# Patient Record
Sex: Male | Born: 1995 | Hispanic: No | Marital: Single | Smoking: Current every day smoker
Health system: Southern US, Community
[De-identification: ages and names within clinical notes are randomized; demographics above are authoritative.]

---
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
## 2014-03-16 ENCOUNTER — Emergency Department (HOSPITAL_COMMUNITY)
Admission: EM | Admit: 2014-03-16 | Discharge: 2014-03-16 | Disposition: A | Discharge status: Home / Self Care | Payer: No Typology Code available for payment source | Attending: Emergency Medicine | Admitting: Emergency Medicine

## 2014-03-16 ENCOUNTER — Encounter (HOSPITAL_COMMUNITY): Payer: Self-pay | Admitting: Emergency Medicine

## 2014-03-16 ENCOUNTER — Emergency Department (HOSPITAL_COMMUNITY): Payer: No Typology Code available for payment source

## 2014-03-16 DIAGNOSIS — F17200 Nicotine dependence, unspecified, uncomplicated: Secondary | ICD-10-CM | POA: Insufficient documentation

## 2014-03-16 DIAGNOSIS — Z79899 Other long term (current) drug therapy: Secondary | ICD-10-CM | POA: Diagnosis not present

## 2014-03-16 DIAGNOSIS — R0789 Other chest pain: Secondary | ICD-10-CM | POA: Diagnosis not present

## 2014-03-16 DIAGNOSIS — Z791 Long term (current) use of non-steroidal anti-inflammatories (NSAID): Secondary | ICD-10-CM | POA: Diagnosis not present

## 2014-03-16 DIAGNOSIS — R079 Chest pain, unspecified: Secondary | ICD-10-CM | POA: Diagnosis present

## 2014-03-16 LAB — BASIC METABOLIC PANEL
Anion gap: 13 (ref 5–15)
BUN: 6 mg/dL (ref 6–23)
CALCIUM: 9.1 mg/dL (ref 8.4–10.5)
CHLORIDE: 99 meq/L (ref 96–112)
CO2: 27 mEq/L (ref 19–32)
CREATININE: 0.75 mg/dL (ref 0.50–1.35)
GFR calc non Af Amer: >90 mL/min (ref >90)
Glucose, Bld: 110 mg/dL — ABNORMAL HIGH (ref 70–99)
Potassium: 3.8 mEq/L (ref 3.7–5.3)
Sodium: 139 mEq/L (ref 137–147)

## 2014-03-16 LAB — TROPONIN I
Troponin I: <0.3 ng/mL (ref <0.30)
Troponin I: <0.3 ng/mL (ref <0.30)

## 2014-03-16 LAB — CBC
HCT: 43.9 % (ref 39.0–52.0)
Hemoglobin: 15.3 g/dL (ref 13.0–17.0)
MCH: 29.3 pg (ref 26.0–34.0)
MCHC: 34.9 g/dL (ref 30.0–36.0)
MCV: 83.9 fL (ref 78.0–100.0)
PLATELETS: 161 10*3/uL (ref 150–400)
RBC: 5.23 MIL/uL (ref 4.22–5.81)
RDW: 12.8 % (ref 11.5–15.5)
WBC: 7.5 10*3/uL (ref 4.0–10.5)

## 2014-03-16 LAB — D-DIMER, QUANTITATIVE: D-Dimer, Quant: 0.32 ug/mL-FEU (ref 0.00–0.48)

## 2014-03-16 MED ORDER — OMEPRAZOLE 20 MG PO CPDR: 20.0000 mg | DELAYED_RELEASE_CAPSULE | Freq: Every day | ORAL | Status: AC

## 2014-03-16 MED ORDER — NAPROXEN 500 MG PO TABS: 500.0000 mg | ORAL_TABLET | Freq: Two times a day (BID) | ORAL | Status: AC

## 2014-03-16 MED ORDER — KETOROLAC TROMETHAMINE 60 MG/2ML IM SOLN
60.0000 mg | Freq: Once | INTRAMUSCULAR | Status: AC
  Administered 2014-03-16: 60 mg via INTRAMUSCULAR
  Filled 2014-03-16: qty 2

## 2014-03-16 MED ORDER — GI COCKTAIL ~~LOC~~
30.0000 mL | Freq: Once | ORAL | Status: AC
  Administered 2014-03-16: 30 mL via ORAL
  Filled 2014-03-16: qty 30

## 2014-03-16 NOTE — Discharge Instructions (Signed)
Chest Pain (Nonspecific) There is no evidence of heart attack or blood clot in the lung. Take the anti-inflammatory medication as prescribed. Return to the ED if you develop new or worsening symptoms. It is often hard to give a specific diagnosis for the cause of chest pain. There is always a chance that your pain could be related to something serious, such as a heart attack or a blood clot in the lungs. You need to follow up with your health care provider for further evaluation. CAUSES   Heartburn.  Pneumonia or bronchitis.  Anxiety or stress.  Inflammation around your heart (pericarditis) or lung (pleuritis or pleurisy).  A blood clot in the lung.  A collapsed lung (pneumothorax). It can develop suddenly on its own (spontaneous pneumothorax) or from trauma to the chest.  Shingles infection (herpes zoster virus). The chest wall is composed of bones, muscles, and cartilage. Any of these can be the source of the pain.  The bones can be bruised by injury.  The muscles or cartilage can be strained by coughing or overwork.  The cartilage can be affected by inflammation and become sore (costochondritis). DIAGNOSIS  Lab tests or other studies may be needed to find the cause of your pain. Your health care provider may have you take a test called an ambulatory electrocardiogram (ECG). An ECG records your heartbeat patterns over a 24-hour period. You may also have other tests, such as:  Transthoracic echocardiogram (TTE). During echocardiography, sound waves are used to evaluate how blood flows through your heart.  Transesophageal echocardiogram (TEE).  Cardiac monitoring. This allows your health care provider to monitor your heart rate and rhythm in real time.  Holter monitor. This is a portable device that records your heartbeat and can help diagnose heart arrhythmias. It allows your health care provider to track your heart activity for several days, if needed.  Stress tests by exercise  or by giving medicine that makes the heart beat faster. TREATMENT   Treatment depends on what may be causing your chest pain. Treatment may include:  Acid blockers for heartburn.  Anti-inflammatory medicine.  Pain medicine for inflammatory conditions.  Antibiotics if an infection is present.  You may be advised to change lifestyle habits. This includes stopping smoking and avoiding alcohol, caffeine, and chocolate.  You may be advised to keep your head raised (elevated) when sleeping. This reduces the chance of acid going backward from your stomach into your esophagus. Most of the time, nonspecific chest pain will improve within 2-3 days with rest and mild pain medicine.  HOME CARE INSTRUCTIONS   If antibiotics were prescribed, take them as directed. Finish them even if you start to feel better.  For the next few days, avoid physical activities that bring on chest pain. Continue physical activities as directed.  Do not use any tobacco products, including cigarettes, chewing tobacco, or electronic cigarettes.  Avoid drinking alcohol.  Only take medicine as directed by your health care provider.  Follow your health care provider's suggestions for further testing if your chest pain does not go away.  Keep any follow-up appointments you made. If you do not go to an appointment, you could develop lasting (chronic) problems with pain. If there is any problem keeping an appointment, call to reschedule. SEEK MEDICAL CARE IF:   Your chest pain does not go away, even after treatment.  You have a rash with blisters on your chest.  You have a fever. SEEK IMMEDIATE MEDICAL CARE IF:   You have  increased chest pain or pain that spreads to your arm, neck, jaw, back, or abdomen.  You have shortness of breath.  You have an increasing cough, or you cough up blood.  You have severe back or abdominal pain.  You feel nauseous or vomit.  You have severe weakness.  You faint.  You have  chills. This is an emergency. Do not wait to see if the pain will go away. Get medical help at once. Call your local emergency services (911 in [place] Do not drive yourself to the hospital. MAKE SURE YOU:   Understand these instructions.  Will watch your condition.  Will get help right away if you are not doing well or get worse. Document Released: 04/13/2005 Document Revised: 07/09/2013 Document Reviewed: 02/07/2008 [facility] Patient Information 2015 [place], [place]. This information is not intended to replace advice given to you by your health care provider. Make sure you discuss any questions you have with your health care provider.

## 2014-03-16 NOTE — ED Notes (Signed)
Patient transported to X-ray 

## 2014-03-16 NOTE — ED Notes (Signed)
Pt reports chest pain since last night. Pt denies n/v/chills. Pt alert and oriented. Pt reports mild sob. No dyspnea noted in triage.

## 2014-03-16 NOTE — ED Provider Notes (Signed)
CSN: [id number]     Arrival date & time 03/16/14  0951 History  This chart was scribed for Glynn Octave, MD by Elon Spanner, ED Scribe. This patient was seen in room APA10/APA10 and the patient's care was started at 10:00 AM.   Chief Complaint  Patient presents with  . Chest Pain   HPI  HPI Comments: Jacob Cunningham is a 18 y.o. male who presents to the Emergency Department complaining of constant, unchanged central chest pain onset yesterday morning.  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  He also complains of bilateral right and left shoulder pain that lasted for approximately 10 hours and has currently resolved on the left side.  Patient states he feels mildly SOB currently.  Patient denies aggravating or alleviating factors.  Patient denies cocaine, marijuana, or other drug use.  Patient occasionally consumes alcohol.  Patient denies cough, abdominal pain, nausea, vomiting, hand tingling/numbness, other medical conditions.  NKA.   History reviewed. No pertinent past medical history. History reviewed. No pertinent past surgical history. History reviewed. No pertinent family history. History  Substance Use Topics  . Smoking status: Current Every Day Smoker -- 0.20 packs/day  . Smokeless tobacco: Not on file  . Alcohol Use: No     Comment: occasional    Review of Systems  A complete 10 system review of systems was obtained and all systems are negative except as noted in the HPI and PMH.   Allergies  Review of patient's allergies indicates no known allergies.  Home Medications   Prior to Admission medications   Medication Sig Start Date End Date Taking? Authorizing Provider  naproxen (NAPROSYN) 500 MG tablet Take 1 tablet (500 mg total) by mouth 2 (two) times daily. 03/16/14   Glynn Octave, MD  omeprazole (PRILOSEC) 20 MG capsule Take 1 capsule (20 mg total) by mouth daily. 03/16/14   Glynn Octave, MD   BP 126/72  Pulse 70  Temp(Src) 98.4  F (36.9 C) (Oral)  Resp 16  Ht  (1.651 m)  Wt 170 lb (77.111 kg)  BMI 28.29 kg/m2  SpO2 96% Physical Exam  Nursing note and vitals reviewed. Constitutional: He is oriented to person, place, and time. He appears well-developed and well-nourished. No distress.  HENT:  Head: Normocephalic and atraumatic.  Mouth/Throat: Oropharynx is clear and moist. No oropharyngeal exudate.  Eyes: Conjunctivae and EOM are normal. Pupils are equal, round, and reactive to light.  Neck: Normal range of motion. Neck supple.  No meningismus.  Cardiovascular: Normal rate, regular rhythm, normal heart sounds and intact distal pulses.   No murmur heard. Equal radial pulses.    Pulmonary/Chest: Effort normal and breath sounds normal. No respiratory distress. He exhibits no tenderness.  Abdominal: Soft. There is no tenderness. There is no rebound and no guarding.  Musculoskeletal: Normal range of motion. He exhibits no edema and no tenderness.  Tenderness to palpation across upper back and trapezius bilaterally.   Neurological: He is alert and oriented to person, place, and time. No cranial nerve deficit. He exhibits normal muscle tone. Coordination normal.  No ataxia on finger to nose bilaterally. No pronator drift. 5/5 strength throughout. CN 2-12 intact. Negative Romberg. Equal grip strength. Sensation intact. Gait is normal.   Skin: Skin is warm.  Psychiatric: He has a normal mood and affect. His behavior is normal.    ED Course  Procedures (including critical care time)  DIAGNOSTIC STUDIES: Oxygen Saturation is 99% on  RA, normal by my interpretation.    COORDINATION OF CARE:  10:13 AM Discussed treatment plan with patient at bedside.  Patient acknowledges and agrees with plan.    11:44 AM Upon recheck, patent's condition is unchanged.  Informed patient that MI and PE had been ruled out.  Will discharge patient home.    1:38 PM Informed patient of diagnosis of pericarditis.  Patient states the  pain has resolved. Will discharge patient home with anti-inflammatory medication.  Labs Review Labs Reviewed  BASIC METABOLIC PANEL - Abnormal; Notable for the following:    Glucose, Bld 110 (*)    All other components within normal limits  CBC  TROPONIN I  D-DIMER, QUANTITATIVE  TROPONIN I    Imaging Review Dg Chest 2 View  03/16/2014   CLINICAL DATA:  Chest pain.  EXAM: CHEST  2 VIEW  COMPARISON:  None.  FINDINGS: Mediastinum and hilar structures normal. The lungs are clear. Heart size normal. No pleural effusion or pneumothorax. No acute bony abnormality.  IMPRESSION: No acute cardiopulmonary disease.   Electronically Signed   By: Maisie Fus  Register   On: 03/16/2014 10:49     EKG Interpretation   Date/Time:  Sunday March 16 2014 11:46:31 EDT Ventricular Rate:  83 PR Interval:  146 QRS Duration: 94 QT Interval:  342 QTC Calculation: 402 R Axis:   77 Text Interpretation:  Sinus rhythm Borderline ST elevation, anterolateral  leads No significant change was found Confirmed by Manus Gunning  MD, Therin Vetsch  ([id number]) on 03/16/2014 12:02:44 PM      MDM   Final diagnoses:  Atypical chest pain   Central chest pain has been constant since last night. Nothing makes it better or worse. Pain does not radiate. Yesterday had some soreness over his upper back and trapezius muscles. There is mild shortness of breath. Denies any drug use cardiac history.  EKG shows ST elevation diffusely with depression. Suspicious for pericarditis. Discussed with Dr. Sharyn Lull who agrees.  Chest x-ray negative. D-dimer negative.  Pain resolved with toradol. Repeat EKG unchanged.  Troponin negative x2.  Will treat supportively with NSAIDs for possible pericarditis.  Doubt ACS, PE, aortic dissection.  I personally performed the services described in this documentation, which was scribed in my presence. The recorded information has been reviewed and is accurate.   Glynn Octave, MD 03/16/14 [phone]

## 2015-07-26 IMAGING — CR DG CHEST 2V
2 series · 2 of 2 positions shown · non-contrast
Comparison: None.

CLINICAL DATA: Chest pain.

EXAM:
CHEST  2 VIEW

[view not recorded (1 of 2)]
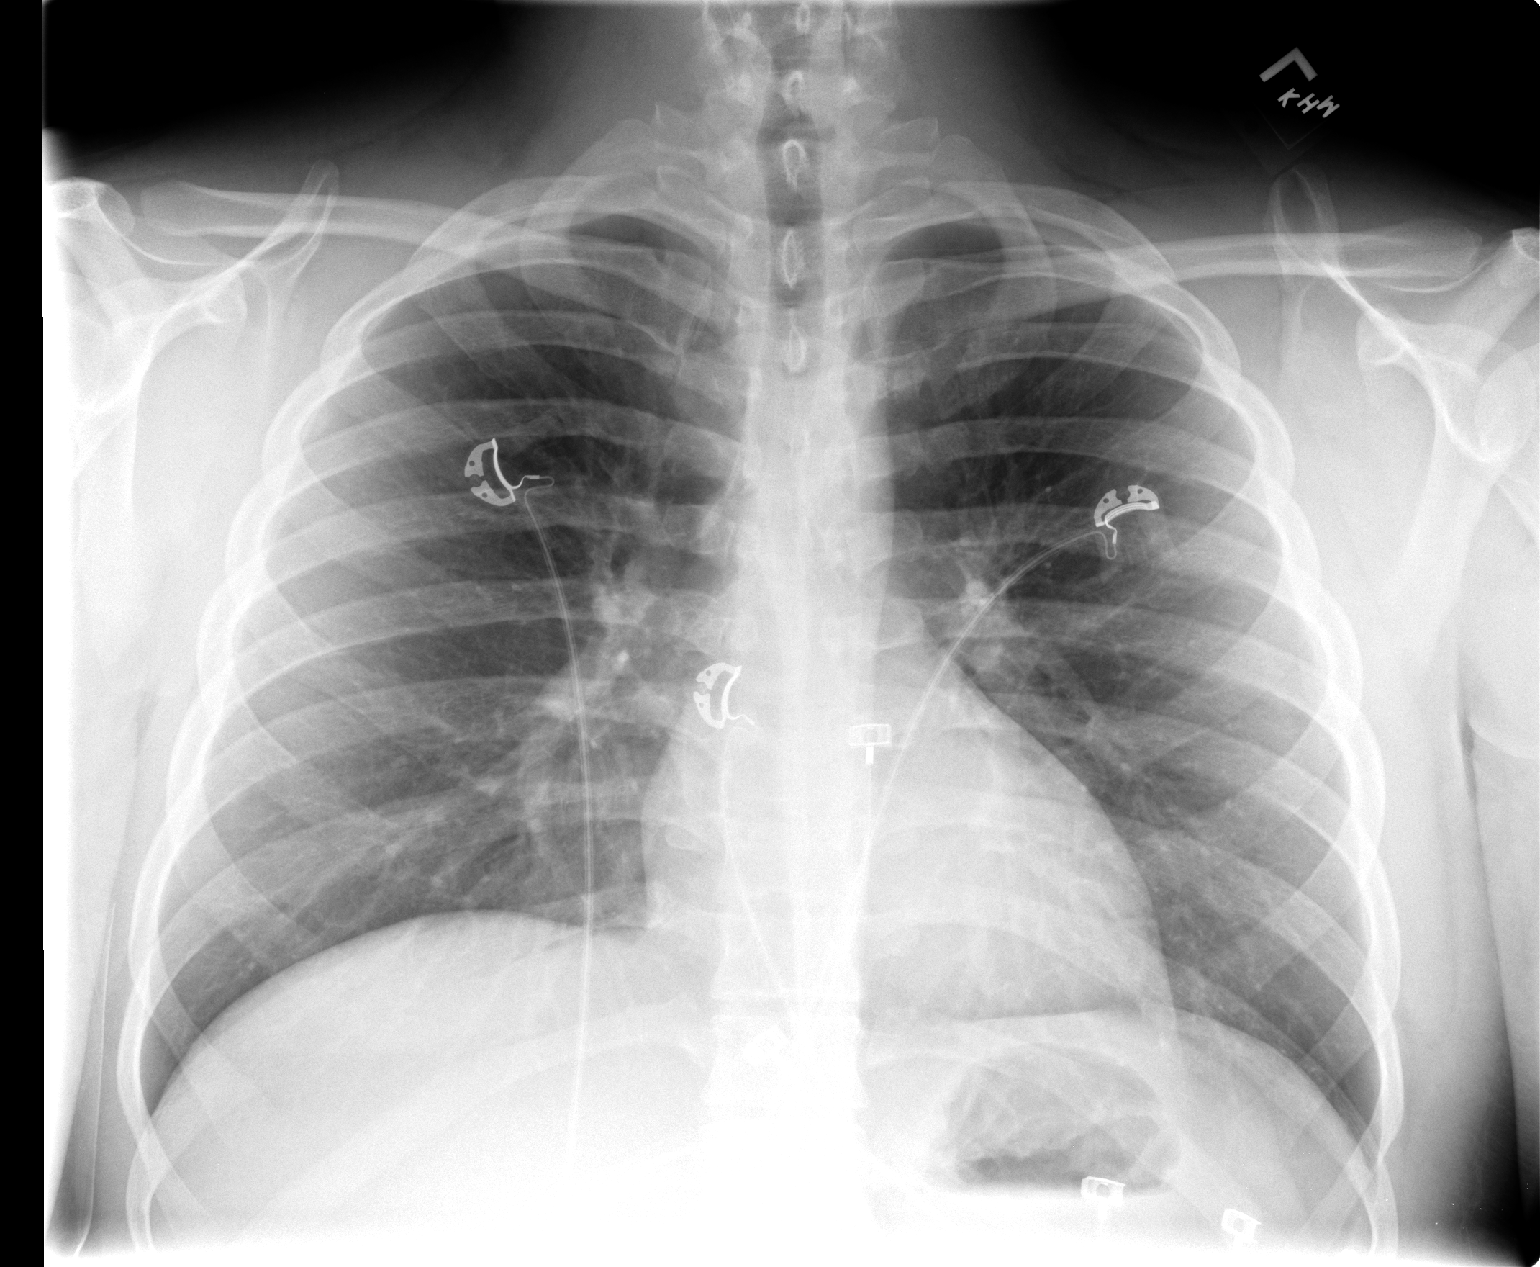

[view not recorded (2 of 2)]
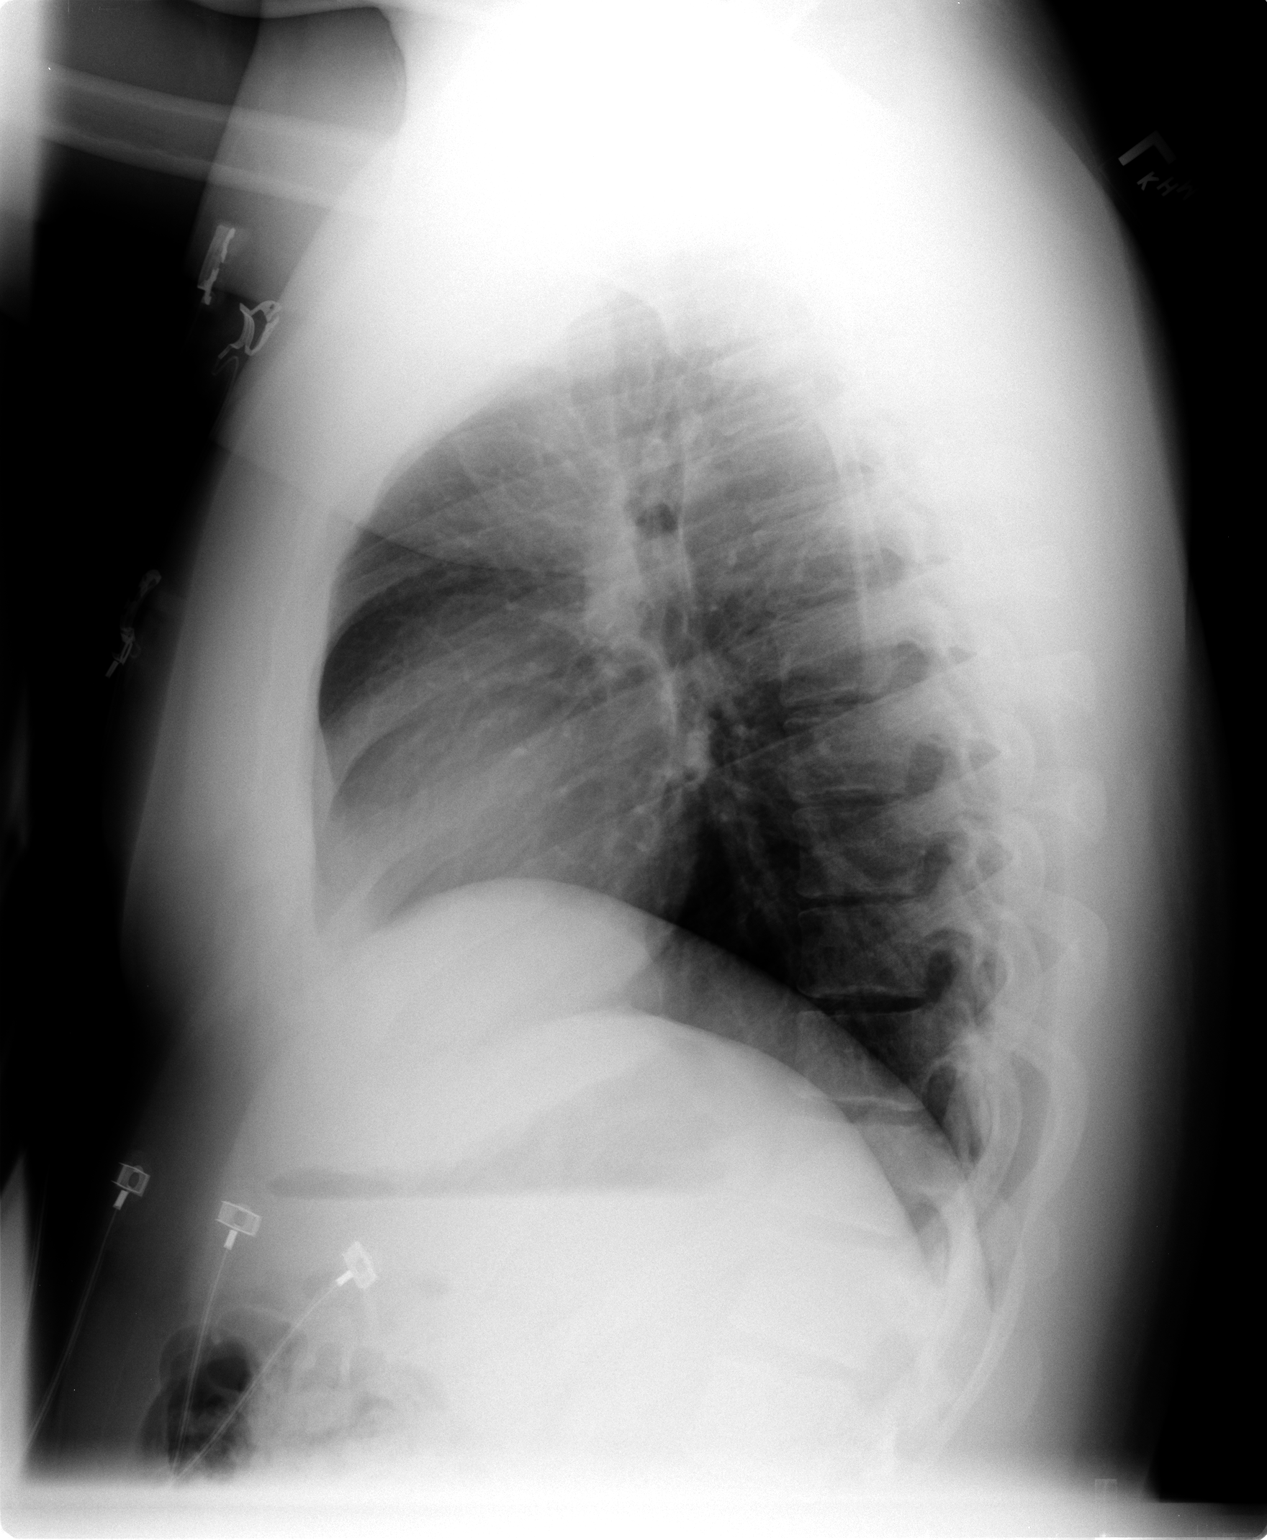

[2 of 2 positions shown; findings below may reference images not displayed]

FINDINGS: Mediastinum and hilar structures normal. The lungs are clear. Heart
size normal. No pleural effusion or pneumothorax. No acute bony
abnormality.
IMPRESSION: No acute cardiopulmonary disease.
